# Patient Record
Sex: Male | Born: 1980
Health system: Southern US, Community
[De-identification: ages and names within clinical notes are randomized; demographics above are authoritative.]

## PROBLEM LIST (undated history)

## (undated) DIAGNOSIS — I10 Essential (primary) hypertension: Secondary | ICD-10-CM

## (undated) DIAGNOSIS — K5732 Diverticulitis of large intestine without perforation or abscess without bleeding: Secondary | ICD-10-CM

---

## 2006-07-29 ENCOUNTER — Emergency Department: Payer: Self-pay | Admitting: Emergency Medicine

## 2006-07-31 ENCOUNTER — Emergency Department: Payer: Self-pay | Admitting: Emergency Medicine

## 2019-02-18 ENCOUNTER — Other Ambulatory Visit (HOSPITAL_COMMUNITY): Payer: Self-pay | Admitting: Infectious Diseases

## 2019-02-18 ENCOUNTER — Inpatient Hospital Stay
Admission: AD | Admit: 2019-02-18 | Discharge: 2019-02-20 | DRG: 392 | Disposition: A | Discharge status: Home / Self Care | Payer: 59 | Source: Ambulatory Visit | Attending: Internal Medicine | Admitting: Internal Medicine

## 2019-02-18 ENCOUNTER — Ambulatory Visit
Admission: RE | Admit: 2019-02-18 | Discharge: 2019-02-18 | Disposition: A | Discharge status: Home / Self Care | Payer: 59 | Source: Ambulatory Visit | Attending: Infectious Diseases | Admitting: Infectious Diseases

## 2019-02-18 ENCOUNTER — Other Ambulatory Visit: Payer: Self-pay

## 2019-02-18 ENCOUNTER — Other Ambulatory Visit: Payer: Self-pay | Admitting: Infectious Diseases

## 2019-02-18 DIAGNOSIS — Z79899 Other long term (current) drug therapy: Secondary | ICD-10-CM

## 2019-02-18 DIAGNOSIS — Z79890 Hormone replacement therapy (postmenopausal): Secondary | ICD-10-CM | POA: Diagnosis not present

## 2019-02-18 DIAGNOSIS — K5720 Diverticulitis of large intestine with perforation and abscess without bleeding: Secondary | ICD-10-CM | POA: Diagnosis present

## 2019-02-18 DIAGNOSIS — R1084 Generalized abdominal pain: Secondary | ICD-10-CM

## 2019-02-18 DIAGNOSIS — E039 Hypothyroidism, unspecified: Secondary | ICD-10-CM | POA: Diagnosis present

## 2019-02-18 DIAGNOSIS — D72825 Bandemia: Secondary | ICD-10-CM

## 2019-02-18 DIAGNOSIS — K5792 Diverticulitis of intestine, part unspecified, without perforation or abscess without bleeding: Secondary | ICD-10-CM | POA: Diagnosis present

## 2019-02-18 DIAGNOSIS — F411 Generalized anxiety disorder: Secondary | ICD-10-CM | POA: Diagnosis present

## 2019-02-18 DIAGNOSIS — F329 Major depressive disorder, single episode, unspecified: Secondary | ICD-10-CM | POA: Diagnosis present

## 2019-02-18 DIAGNOSIS — E669 Obesity, unspecified: Secondary | ICD-10-CM | POA: Diagnosis present

## 2019-02-18 DIAGNOSIS — Z6836 Body mass index (BMI) 36.0-36.9, adult: Secondary | ICD-10-CM

## 2019-02-18 DIAGNOSIS — Z20828 Contact with and (suspected) exposure to other viral communicable diseases: Secondary | ICD-10-CM | POA: Diagnosis present

## 2019-02-18 DIAGNOSIS — G4733 Obstructive sleep apnea (adult) (pediatric): Secondary | ICD-10-CM | POA: Diagnosis present

## 2019-02-18 DIAGNOSIS — I10 Essential (primary) hypertension: Secondary | ICD-10-CM | POA: Diagnosis present

## 2019-02-18 DIAGNOSIS — Z87891 Personal history of nicotine dependence: Secondary | ICD-10-CM | POA: Diagnosis not present

## 2019-02-18 HISTORY — DX: Essential (primary) hypertension: I10

## 2019-02-18 LAB — COMPREHENSIVE METABOLIC PANEL
ALT: 22 U/L (ref 0–44)
AST: 22 U/L (ref 15–41)
Albumin: 4.2 g/dL (ref 3.5–5.0)
Alkaline Phosphatase: 80 U/L (ref 38–126)
Anion gap: 14 (ref 5–15)
BUN: 24 mg/dL — ABNORMAL HIGH (ref 6–20)
CO2: 24 mmol/L (ref 22–32)
Calcium: 9.2 mg/dL (ref 8.9–10.3)
Chloride: 96 mmol/L — ABNORMAL LOW (ref 98–111)
Creatinine, Ser: 0.99 mg/dL (ref 0.61–1.24)
GFR calc Af Amer: >60 mL/min (ref >60)
GFR calc non Af Amer: >60 mL/min (ref >60)
Glucose, Bld: 118 mg/dL — ABNORMAL HIGH (ref 70–99)
Potassium: 3.6 mmol/L (ref 3.5–5.1)
Sodium: 134 mmol/L — ABNORMAL LOW (ref 135–145)
Total Bilirubin: 0.7 mg/dL (ref 0.3–1.2)
Total Protein: 8.2 g/dL — ABNORMAL HIGH (ref 6.5–8.1)

## 2019-02-18 LAB — CBC WITH DIFFERENTIAL/PLATELET
Abs Immature Granulocytes: 0.04 10*3/uL (ref 0.00–0.07)
Basophils Absolute: 0 10*3/uL (ref 0.0–0.1)
Basophils Relative: 0 %
Eosinophils Absolute: 0.1 10*3/uL (ref 0.0–0.5)
Eosinophils Relative: 1 %
HCT: 43 % (ref 39.0–52.0)
Hemoglobin: 14.8 g/dL (ref 13.0–17.0)
Immature Granulocytes: 0 %
Lymphocytes Relative: 11 %
Lymphs Abs: 1.3 10*3/uL (ref 0.7–4.0)
MCH: 28.2 pg (ref 26.0–34.0)
MCHC: 34.4 g/dL (ref 30.0–36.0)
MCV: 82.1 fL (ref 80.0–100.0)
Monocytes Absolute: 1.2 10*3/uL — ABNORMAL HIGH (ref 0.1–1.0)
Monocytes Relative: 10 %
Neutro Abs: 8.9 10*3/uL — ABNORMAL HIGH (ref 1.7–7.7)
Neutrophils Relative %: 78 %
Platelets: 319 10*3/uL (ref 150–400)
RBC: 5.24 MIL/uL (ref 4.22–5.81)
RDW: 12.3 % (ref 11.5–15.5)
WBC: 11.5 10*3/uL — ABNORMAL HIGH (ref 4.0–10.5)
nRBC: 0 % (ref 0.0–0.2)

## 2019-02-18 LAB — LIPASE, BLOOD: Lipase: 20 U/L (ref 11–51)

## 2019-02-18 LAB — POCT I-STAT CREATININE: Creatinine, Ser: 1 mg/dL (ref 0.61–1.24)

## 2019-02-18 MED ORDER — BUSPIRONE HCL 15 MG PO TABS
7.5000 mg | ORAL_TABLET | Freq: Every day | ORAL | Status: DC
  Administered 2019-02-19 – 2019-02-20 (×2): 7.5 mg via ORAL
  Filled 2019-02-18 (×3): qty 1

## 2019-02-18 MED ORDER — ACETAMINOPHEN 325 MG PO TABS: 650.0000 mg | ORAL_TABLET | Freq: Four times a day (QID) | ORAL | Status: DC | PRN

## 2019-02-18 MED ORDER — LEVOTHYROXINE SODIUM 50 MCG PO TABS
125.0000 ug | ORAL_TABLET | Freq: Every day | ORAL | Status: DC
  Administered 2019-02-19 – 2019-02-20 (×2): 125 ug via ORAL
  Filled 2019-02-18 (×2): qty 1

## 2019-02-18 MED ORDER — IOHEXOL 300 MG/ML  SOLN
125.0000 mL | Freq: Once | INTRAMUSCULAR | Status: AC | PRN
  Administered 2019-02-18: 12:00:00 125 mL via INTRAVENOUS

## 2019-02-18 MED ORDER — LISINOPRIL 20 MG PO TABS
20.0000 mg | ORAL_TABLET | Freq: Every day | ORAL | Status: DC
  Administered 2019-02-20: 10:00:00 20 mg via ORAL
  Filled 2019-02-18: qty 1

## 2019-02-18 MED ORDER — ONDANSETRON HCL 4 MG/2ML IJ SOLN: 4.0000 mg | Freq: Four times a day (QID) | INTRAMUSCULAR | Status: DC | PRN

## 2019-02-18 MED ORDER — PIPERACILLIN-TAZOBACTAM 3.375 G IVPB
3.3750 g | Freq: Three times a day (TID) | INTRAVENOUS | Status: DC
  Administered 2019-02-18 – 2019-02-20 (×5): 3.375 g via INTRAVENOUS
  Filled 2019-02-18 (×6): qty 50

## 2019-02-18 MED ORDER — ONDANSETRON HCL 4 MG PO TABS: 4.0000 mg | ORAL_TABLET | Freq: Four times a day (QID) | ORAL | Status: DC | PRN

## 2019-02-18 MED ORDER — ENOXAPARIN SODIUM 40 MG/0.4ML ~~LOC~~ SOLN
40.0000 mg | SUBCUTANEOUS | Status: DC
  Administered 2019-02-18: 40 mg via SUBCUTANEOUS
  Filled 2019-02-18: qty 0.4

## 2019-02-18 MED ORDER — HYDROCODONE-ACETAMINOPHEN 5-325 MG PO TABS: 1.0000 | ORAL_TABLET | ORAL | Status: DC | PRN

## 2019-02-18 MED ORDER — SODIUM CHLORIDE 0.9 % IV SOLN
INTRAVENOUS | Status: DC
  Administered 2019-02-18 – 2019-02-20 (×4): via INTRAVENOUS

## 2019-02-18 MED ORDER — MELATONIN 5 MG PO TABS
5.0000 mg | ORAL_TABLET | Freq: Every evening | ORAL | Status: DC | PRN
  Administered 2019-02-18: 5 mg via ORAL
  Filled 2019-02-18 (×3): qty 1

## 2019-02-18 MED ORDER — ACETAMINOPHEN 650 MG RE SUPP: 650.0000 mg | Freq: Four times a day (QID) | RECTAL | Status: DC | PRN

## 2019-02-18 MED ORDER — LISINOPRIL-HYDROCHLOROTHIAZIDE 20-25 MG PO TABS: 1.0000 | ORAL_TABLET | Freq: Every day | ORAL | Status: DC

## 2019-02-18 MED ORDER — ESCITALOPRAM OXALATE 10 MG PO TABS
20.0000 mg | ORAL_TABLET | Freq: Every day | ORAL | Status: DC
  Administered 2019-02-19 – 2019-02-20 (×2): 20 mg via ORAL
  Filled 2019-02-18 (×3): qty 2

## 2019-02-18 MED ORDER — HYDROCHLOROTHIAZIDE 25 MG PO TABS
25.0000 mg | ORAL_TABLET | Freq: Every day | ORAL | Status: DC
  Administered 2019-02-20: 25 mg via ORAL
  Filled 2019-02-18: qty 1

## 2019-02-18 MED ORDER — POLYETHYLENE GLYCOL 3350 17 G PO PACK: 17.0000 g | PACK | Freq: Every day | ORAL | Status: DC | PRN

## 2019-02-18 NOTE — H&P (Signed)
[facility] [place] NAME: Jordan Church    MR#:  [id number]  DATE OF BIRTH:  31-Jan-1981  DATE OF ADMISSION:  02/18/2019  PRIMARY CARE PHYSICIAN: Leonel Ramsay, MD   REQUESTING/REFERRING PHYSICIAN: Adrian Prows, MD  CHIEF COMPLAINT:  No chief complaint on file.  Abdominal Pain  HISTORY OF PRESENT ILLNESS:   38 year old male with past medical history of OSA on CPAP, obesity, bronchitis, generalized anxiety disorder, hypertension, depression and hypothyroidism presenting as a direct admit from PCPs office with complaints of abdominal pain.  Patient reports onset of symptoms since last Thursday with associated constipation and nausea without vomiting.  He saw his PCP and was started on Augmentin with patient reporting improvement.  Patient states he was able to have a bowel movement since starting Augmentin however he still having abdominal pain with new onset of pain and chills.  He called his PCP who reported CT of the abdomen and pelvis and lab work.  Initial labs at PCPs office showed elevated white count 20.5.  CT abdomen revealed diverticulitis with possible abscess.  Given this finding patient was admitted to hospitalist service for further management.  On arrival to the unit, he was afebrile with blood pressure 108/66 mm Hg and pulse rate 85 beats/min. There were no focal neurological deficits; he was alert and oriented x4.  Initial labs revealed sodium 134, glucose 92, BUN 24 otherwise unremarkable CMP, WBC 11.5, lipase 20.    PAST MEDICAL HISTORY:  History reviewed. No pertinent past medical history.  PAST SURGICAL HISTORY:  History reviewed. No pertinent surgical history.  SOCIAL HISTORY:   Social History   Tobacco Use  . Smoking status: Former Research scientist (life sciences)  . Smokeless tobacco: Never Used  Substance Use Topics  . Alcohol use: Not on file    FAMILY HISTORY:  History reviewed. No pertinent family history.  DRUG  ALLERGIES:  Not on File  REVIEW OF SYSTEMS:   Review of Systems  Constitutional: Positive for chills, diaphoresis and fever. Negative for malaise/fatigue and weight loss.  HENT: Negative for congestion, hearing loss and sore throat.   Eyes: Negative for blurred vision and double vision.  Respiratory: Negative for cough, shortness of breath and wheezing.   Cardiovascular: Negative for chest pain, palpitations, orthopnea and leg swelling.  Gastrointestinal: Positive for abdominal pain, constipation and nausea. Negative for diarrhea and vomiting.  Genitourinary: Negative for dysuria and urgency.  Musculoskeletal: Negative for myalgias.  Skin: Negative for rash.  Neurological: Negative for dizziness, sensory change, speech change, focal weakness and headaches.  Psychiatric/Behavioral: Negative for depression.   MEDICATIONS AT HOME:   Prior to Admission medications   Medication Sig Start Date End Date Taking? Authorizing Provider  busPIRone (BUSPAR) 7.5 MG tablet Take 7.5 mg by mouth daily.   Yes [provider]  escitalopram (LEXAPRO) 20 MG tablet Take 20 mg by mouth daily.   Yes [provider]  levothyroxine (SYNTHROID) 125 MCG tablet Take 125 mcg by mouth daily before breakfast.   Yes [provider]  lisinopril-hydrochlorothiazide (ZESTORETIC) 20-25 MG tablet Take 1 tablet by mouth daily.   Yes [provider]  Melatonin 5 MG TABS Take 5 mg by mouth at bedtime as needed.   Yes [provider]      VITAL SIGNS:  Blood pressure 117/61, pulse 76, temperature 99.5 F (37.5 C), temperature source Oral, resp. rate 20, height 5\' 11"  (1.803 m), weight 118.8 kg, SpO2 97 %.  PHYSICAL EXAMINATION:  Physical Exam  GENERAL:  38 y.o.-year-old patient lying in the bed with no acute distress.  EYES: Pupils equal, round, reactive to light and accommodation. No scleral icterus. Extraocular muscles intact.  HEENT: Head atraumatic, normocephalic.  Oropharynx and nasopharynx clear.  NECK:  Supple, no jugular venous distention. No thyroid enlargement, no tenderness.  LUNGS: Normal breath sounds bilaterally, no wheezing, rales,rhonchi or crepitation. No use of accessory muscles of respiration.  CARDIOVASCULAR: S1, S2 normal. No murmurs, rubs, or gallops.  ABDOMEN: Soft, nontender, nondistended. Bowel sounds present. No organomegaly or mass.  EXTREMITIES: No pedal edema, cyanosis, or clubbing. No rash or lesions. + pedal pulses MUSCULOSKELETAL: Normal bulk, and power was 5+ grip and elbow, knee, and ankle flexion and extension bilaterally.  NEUROLOGIC:Alert and oriented x 3. CN 2-12 intact. Sensation to light touch and cold stimuli intact bilaterally. Gait not tested due to safety concern. PSYCHIATRIC: The patient is alert and oriented x 3.  SKIN: No obvious rash, lesion, or ulcer.   DATA REVIEWED:  LABORATORY PANEL:   CBC Recent Labs  Lab 02/18/19 1554  WBC 11.5*  HGB 14.8  HCT 43.0  PLT 319   ------------------------------------------------------------------------------------------------------------------  Chemistries  Recent Labs  Lab 02/18/19 1554  NA 134*  K 3.6  CL 96*  CO2 24  GLUCOSE 118*  BUN 24*  CREATININE 0.99  CALCIUM 9.2  AST 22  ALT 22  ALKPHOS 80  BILITOT 0.7   ------------------------------------------------------------------------------------------------------------------  Cardiac Enzymes No results for input(s): TROPONINI in the last 168 hours. ------------------------------------------------------------------------------------------------------------------  RADIOLOGY:  No results found.  EKG:  EKG: there are no previous tracings available   IMPRESSION AND PLAN:   38 y.o. male  with past medical history of OSA on CPAP, obesity, bronchitis, generalized anxiety disorder, hypertension, depression and hypothyroidism presenting as a direct admit from PCPs office with complaints of abdominal  pain.  1. Acute diverticulitis with abscess - patient with acute abdominal pain, fevers and chills - Admit to medsurg unit - CT abdomen pelvis with findings of acute diverticulitis and possible abscess - Check CBC, CMP, and lipase - Blood cultures - IV fluids hydration - PRN IV and p.o. pain management - Start empiric antibiotics with Zosyn - Surgery consult for evaluation of abscess, may also need GI  2. HTN  + Goal BP <130/80 -Continue lisinopril  3. Hypothyroidism -continue Synthroid  4. Anxiety and depression -continue Lexapro and BuSpar  5. OSA -on CPAP  6. DVT prophylaxis - Enoxaparin SubQ    All the records are reviewed and case discussed with ED provider. Management plans discussed with the patient, family and they are in agreement.  CODE STATUS: FULL  TOTAL TIME TAKING CARE OF THIS PATIENT: 50 minutes.    on 02/18/2019 at 10:02 PM  Webb SilversmithElizabeth Ouma, DNP, FNP-BC [facility] Nurse Practitioner Between 7am to 6pm - Pager [phone]- [phone]  After 6pm go to [email or web address] - Social research officer, governmentpassword [email or web address]  [facility]  Office  [phone]  CC: Primary care physician; Mick SellFitzgerald, David P, MD

## 2019-02-18 NOTE — Consult Note (Signed)
Pharmacy Antibiotic Note  Jordan Church is a 38 y.o. male admitted on 02/18/2019 with intra-abdominal infection.  Pharmacy has been consulted for Zosyn dosing.  Plan: Zosyn 3.375g IV q8h (4 hour infusion).  Height: 5\' 11"  (180.3 cm) Weight: 261 lb 14.5 oz (118.8 kg) IBW/kg (Calculated) : 75.3  Temp (24hrs), Avg:98.6 F (37 C), Min:98.6 F (37 C), Max:98.6 F (37 C)  Recent Labs  Lab 02/18/19 1554  WBC 11.5*  CREATININE 0.99    Estimated Creatinine Clearance: 132.7 mL/min (by C-G formula based on SCr of 0.99 mg/dL).    Not on File  Antimicrobials this admission: Zosyn 8/24 >>    Microbiology results: None ordered currently  Thank you for allowing pharmacy to be a part of this patient's care.  Glennie Rodda A Josy Peaden 02/18/2019 5:15 PM

## 2019-02-19 LAB — SARS CORONAVIRUS 2 (TAT 6-24 HRS): SARS Coronavirus 2: NEGATIVE

## 2019-02-19 LAB — HIV ANTIBODY (ROUTINE TESTING W REFLEX): HIV Screen 4th Generation wRfx: NONREACTIVE

## 2019-02-19 NOTE — Progress Notes (Signed)
[facility] [place] [facility]                                                                                                                                                                                  Patient Demographics   Jordan Church, is a 38 y.o. male, DOB - 1980/08/22, UJW:[id number]  Admit date - 02/18/2019   Admitting Physician Jimmye NormanElizabeth Achieng Ouma, NP  Outpatient Primary MD for the patient is Mick SellFitzgerald, David P, MD   LOS - 1  Subjective:     Review of Systems:   CONSTITUTIONAL: No documented fever. No fatigue, weakness. No weight gain, no weight loss.  EYES: No blurry or double vision.  ENT: No tinnitus. No postnasal drip. No redness of the oropharynx.  RESPIRATORY: No cough, no wheeze, no hemoptysis. No dyspnea.  CARDIOVASCULAR: No chest pain. No orthopnea. No palpitations. No syncope.  GASTROINTESTINAL: No nausea, no vomiting or diarrhea. No abdominal pain. No melena or hematochezia.  GENITOURINARY: No dysuria or hematuria.  ENDOCRINE: No polyuria or nocturia. No heat or cold intolerance.  HEMATOLOGY: No anemia. No bruising. No bleeding.  INTEGUMENTARY: No rashes. No lesions.  MUSCULOSKELETAL: No arthritis. No swelling. No gout.  NEUROLOGIC: No numbness, tingling, or ataxia. No seizure-type activity.  PSYCHIATRIC: No anxiety. No insomnia. No ADD.    Vitals:   Vitals:   02/18/19 2016 02/18/19 2327 02/19/19 0507 02/19/19 1153  BP: 117/61  106/67 113/71  Pulse: 76 68 63 62  Resp: 20 18 18 18   Temp: 99.5 F (37.5 C)  97.9 F (36.6 C) 98.5 F (36.9 C)  TempSrc: Oral  Oral Oral  SpO2: 97% 98% 99% 97%  Weight:      Height:        Wt Readings from Last 3 Encounters:  02/18/19 118.8 kg     Intake/Output Summary (Last 24 hours) at 02/19/2019 1349 Last data filed at 02/19/2019 1152 Gross per 24 hour  Intake 2482.4 ml  Output -  Net 2482.4 ml    Physical Exam:   GENERAL: Pleasant-appearing in no apparent distress.  HEAD,  EYES, EARS, NOSE AND THROAT: Atraumatic, normocephalic. Extraocular muscles are intact. Pupils equal and reactive to light. Sclerae anicteric. No conjunctival injection. No oro-pharyngeal erythema.  NECK: Supple. There is no jugular venous distention. No bruits, no lymphadenopathy, no thyromegaly.  HEART: Regular rate and rhythm,. No murmurs, no rubs, no clicks.  LUNGS: Clear to auscultation bilaterally. No rales or rhonchi. No wheezes.  ABDOMEN: Soft, flat, nontender, nondistended. Has good bowel sounds. No hepatosplenomegaly appreciated.  EXTREMITIES: No evidence of any cyanosis, clubbing, or peripheral edema.  +2 pedal and radial pulses bilaterally.  NEUROLOGIC: The  patient is alert, awake, and oriented x3 with no focal motor or sensory deficits appreciated bilaterally.  SKIN: Moist and warm with no rashes appreciated.  Psych: Not anxious, depressed LN: No inguinal LN enlargement    Antibiotics   Anti-infectives (From admission, onward)   Start     Dose/Rate Route Frequency Ordered Stop   02/18/19 1730  piperacillin-tazobactam (ZOSYN) IVPB 3.375 g     3.375 g 12.5 mL/hr over 240 Minutes Intravenous Every 8 hours 02/18/19 1709        Medications   Scheduled Meds: . busPIRone  7.5 mg Oral Daily  . enoxaparin (LOVENOX) injection  40 mg Subcutaneous Q24H  . escitalopram  20 mg Oral Daily  . lisinopril  20 mg Oral Daily   And  . hydrochlorothiazide  25 mg Oral Daily  . levothyroxine  125 mcg Oral Q0600   Continuous Infusions: . sodium chloride 100 mL/hr at 02/19/19 1152  . piperacillin-tazobactam (ZOSYN)  IV Stopped (02/19/19 0915)   PRN Meds:.acetaminophen **OR** acetaminophen, HYDROcodone-acetaminophen, Melatonin, ondansetron **OR** ondansetron (ZOFRAN) IV, polyethylene glycol   Data Review:   Micro Results Recent Results (from the past 240 hour(s))  SARS CORONAVIRUS 2 (TAT 6-12 HRS) Nasal Swab Aptima Multi Swab     Status: None   Collection Time: 02/18/19  3:58 PM    Specimen: Aptima Multi Swab; Nasal Swab  Result Value Ref Range Status   SARS Coronavirus 2 NEGATIVE NEGATIVE Final    Comment: (NOTE) SARS-CoV-2 target nucleic acids are NOT DETECTED. The SARS-CoV-2 RNA is generally detectable in upper and lower respiratory specimens during the acute phase of infection. Negative results do not preclude SARS-CoV-2 infection, do not rule out co-infections with other pathogens, and should not be used as the sole basis for treatment or other patient management decisions. Negative results must be combined with clinical observations, patient history, and epidemiological information. The expected result is Negative. Fact Sheet for Patients: SugarRoll.be Fact Sheet for Healthcare Providers: https://www.woods-mathews.com/ This test is not yet approved or cleared by the [place] FDA and  has been authorized for detection and/or diagnosis of SARS-CoV-2 by FDA under an Emergency Use Authorization (EUA). This EUA will remain  in effect (meaning this test can be used) for the duration of the COVID-19 declaration under Section 56 4(b)(1) of the Act, 21 U.S.C. section 360bbb-3(b)(1), unless the authorization is terminated or revoked sooner. Performed at [facility], [place] [address], [place], [place] [address]   CULTURE, BLOOD (ROUTINE X 2) w Reflex to ID Panel     Status: None (Preliminary result)   Collection Time: 02/18/19  7:22 PM   Specimen: BLOOD  Result Value Ref Range Status   Specimen Description BLOOD LFOA  Final   Special Requests   Final    BOTTLES DRAWN AEROBIC AND ANAEROBIC Blood Culture adequate volume   Culture   Final    NO GROWTH < 12 HOURS Performed at [facility], [address], [place], [place] [address]    Report Status PENDING  Incomplete  CULTURE, BLOOD (ROUTINE X 2) w Reflex to ID Panel     Status: None (Preliminary result)   Collection Time: 02/18/19  7:29 PM    Specimen: BLOOD  Result Value Ref Range Status   Specimen Description BLOOD LAC  Final   Special Requests   Final    BOTTLES DRAWN AEROBIC AND ANAEROBIC Blood Culture adequate volume   Culture   Final    NO GROWTH < 12 HOURS  Performed at [facility], [address] [place], [place] [address]    Report Status PENDING  Incomplete    Radiology Reports Ct Abdomen Pelvis W Contrast  Result Date: 02/18/2019 CLINICAL DATA:  Lower abdominal pain with chills for 4 days. EXAM: CT ABDOMEN AND PELVIS WITH CONTRAST TECHNIQUE: Multidetector CT imaging of the abdomen and pelvis was performed using the standard protocol following bolus administration of intravenous contrast. CONTRAST:  125mL OMNIPAQUE IOHEXOL 300 MG/ML  SOLN COMPARISON:  None. FINDINGS: Lower chest: Small type 1 hiatal hernia. Hepatobiliary: Unremarkable Pancreas: Unremarkable Spleen: Unremarkable Adrenals/Urinary Tract: Unremarkable Stomach/Bowel: The appendix measures 6-7 mm in diameter, upper normal. There is abnormal inflammatory stranding in the sigmoid colon mesentery somewhat eccentric to the left the most closely associated with the sigmoid colon which demonstrates diverticulosis. This may be a manifestation of recent diverticulitis. There is a small fluid density collection measuring 1.9 by 1.4 by 2.4 cm within the sigmoid colon mesentery on image 76/2 which may represent a small abscess although there is no internal gas. Also anterior to the upper rectum there is a 2.3 by 3.5 by 2.6 cm collection of fluid which may be free-flowing but incipient abscess formation is difficult to exclude given the slight enhancement along the margins. There is generalized wall thickening in the sigmoid colon and upper rectum although some of this may be secondary to diverticulosis and some secondary to nondistention. Small amount of stranding is noted along the lower omentum. Vascular/Lymphatic: Borderline prominent peripancreatic lymph nodes  including a 1.0 cm in short axis node on image 27/2 and another on image 32/2. Reproductive: Unremarkable Other: No supplemental non-categorized findings. Musculoskeletal: Unremarkable IMPRESSION: 1. Abnormal inflammatory stranding in the sigmoid mesentery with wall thickening in the upper rectum and sigmoid colon along with sigmoid colon diverticulosis. Along there is a small the fluid collection eccentric to the left sigmoid mesentery and another collection of fluid anterior to the upper rectum, the former is suspicious for a small abscess in the latter may be an incipient abscess given the mild enhancement along its margins. Suspect that this represents findings related to acute diverticulitis. 2. Borderline prominent peripancreatic lymph nodes, likely reactive. 3. Small type 1 hiatal hernia. Electronically Signed   By: Gaylyn RongWalter  Liebkemann M.D.   On: 02/18/2019 12:41     CBC Recent Labs  Lab 02/18/19 1554  WBC 11.5*  HGB 14.8  HCT 43.0  PLT 319  MCV 82.1  MCH 28.2  MCHC 34.4  RDW 12.3  LYMPHSABS 1.3  MONOABS 1.2*  EOSABS 0.1  BASOSABS 0.0    Chemistries  Recent Labs  Lab 02/18/19 1211 02/18/19 1554  NA  --  134*  K  --  3.6  CL  --  96*  CO2  --  24  GLUCOSE  --  118*  BUN  --  24*  CREATININE 1.00 0.99  CALCIUM  --  9.2  AST  --  22  ALT  --  22  ALKPHOS  --  80  BILITOT  --  0.7   ------------------------------------------------------------------------------------------------------------------ estimated creatinine clearance is 132.7 mL/min (by C-G formula based on SCr of 0.99 mg/dL). ------------------------------------------------------------------------------------------------------------------ No results for input(s): HGBA1C in the last 72 hours. ------------------------------------------------------------------------------------------------------------------ No results for input(s): CHOL, HDL, LDLCALC, TRIG, CHOLHDL, LDLDIRECT in the last 72  hours. ------------------------------------------------------------------------------------------------------------------ No results for input(s): TSH, T4TOTAL, T3FREE, THYROIDAB in the last 72 hours.  Invalid input(s): FREET3 ------------------------------------------------------------------------------------------------------------------ No results for input(s): VITAMINB12, FOLATE, FERRITIN, TIBC, IRON, RETICCTPCT in the  last 72 hours.  Coagulation profile No results for input(s): INR, PROTIME in the last 168 hours.  No results for input(s): DDIMER in the last 72 hours.  Cardiac Enzymes No results for input(s): CKMB, TROPONINI, MYOGLOBIN in the last 168 hours.  Invalid input(s): CK ------------------------------------------------------------------------------------------------------------------ Invalid input(s): POCBNP    Assessment & Plan   38 y.o. male  with past medical history of OSA on CPAP, obesity, bronchitis, generalized anxiety disorder, hypertension, depression and hypothyroidism presenting as a direct admit from PCPs office with complaints of abdominal pain.  1. Acute diverticulitis with abscess - patient with acute abdominal pain, fevers and chills Continue IV Zosyn Abscesses noted in the abdomen  2. HTN  + Goal BP <130/80 -Hold lisinopril  3. Hypothyroidism -continue Synthroid  4. Anxiety and depression -continue Lexapro and BuSpar  5. OSA -on CPAP  6. DVT prophylaxis - Enoxaparin SubQ      Code Status Orders  (From admission, onward)         Start     Ordered   02/18/19 1544  Full code  Continuous     02/18/19 1546        Code Status History    This patient has a current code status but no historical code status.   Advance Care Planning Activity           Consults  surgery  DVT Prophylaxis  Lovenox    Lab Results  Component Value Date   PLT 319 02/18/2019     Time Spent in minutes  35min Greater than 50% of time spent  in care coordination and counseling patient regarding the condition and plan of care.   Auburn BilberryShreyang Jamerson Vonbargen M.D on 02/19/2019 at 1:49 PM  Between 7am to 6pm - Pager - [phone]  After 6pm go to [email or web address] - Social research officer, governmentpassword EPAS [email or web address]  [facility]   Office  [phone]

## 2019-02-19 NOTE — Progress Notes (Signed)
Subjective:   CC: diverticulitis  HPI:  Jordan Church is a 38 y.o. male who was consulted by Allena KatzPatel for issue above.  Symptoms were first noted 5 days ago. Pain is sudden onset, confined to the suparpubic area, without radiation.  Associated with diarrhea, exacerbated by nothing specific.    Started on abx with primary, but no improvement.  CT showed possible abscess so admitted for further obs, IV abx.     Past Medical History:  has a past medical history of Hypertension.  Past Surgical History: none reported  Family History: aunt and uncle with Crohn's  Social History:  reports that he has quit smoking. He has never used smokeless tobacco. No history on file for alcohol and drug.  Current Medications:  Medications Prior to Admission  Medication Sig Dispense Refill  . busPIRone (BUSPAR) 7.5 MG tablet Take 7.5 mg by mouth daily.    Marland Kitchen. escitalopram (LEXAPRO) 20 MG tablet Take 20 mg by mouth daily.    Marland Kitchen. levothyroxine (SYNTHROID) 125 MCG tablet Take 125 mcg by mouth daily before breakfast.    . lisinopril-hydrochlorothiazide (ZESTORETIC) 20-25 MG tablet Take 1 tablet by mouth daily.    . Melatonin 5 MG TABS Take 5 mg by mouth at bedtime as needed.      Allergies:  Allergies as of 02/18/2019  . (Not on File)    ROS:  General: Denies weight loss, weight gain, fatigue, fevers, chills, and night sweats. Eyes: Denies blurry vision, double vision, eye pain, itchy eyes, and tearing. Ears: Denies hearing loss, earache, and ringing in ears. Nose: Denies sinus pain, congestion, infections, runny nose, and nosebleeds. Mouth/throat: Denies hoarseness, sore throat, bleeding gums, and difficulty swallowing. Heart: Denies chest pain, palpitations, racing heart, irregular heartbeat, leg pain or swelling, and decreased activity tolerance. Respiratory: Denies breathing difficulty, shortness of breath, wheezing, cough, and sputum. GI: Denies change in appetite, heartburn, nausea, vomiting,  constipation, and blood in stool. GU: Denies difficulty urinating, pain with urinating, urgency, frequency, blood in urine. Musculoskeletal: Denies joint stiffness, pain, swelling, muscle weakness. Skin: Denies rash, itching, mass, tumors, sores, and boils Neurologic: Denies headache, fainting, dizziness, seizures, numbness, and tingling. Psychiatric: Denies depression, anxiety, difficulty sleeping, and memory loss. Endocrine: Denies heat or cold intolerance, and increased thirst or urination. Blood/lymph: Denies easy bruising, easy bruising, and swollen glands     Objective:     BP 113/71 (BP Location: Left Arm)   Pulse 62   Temp 98.5 F (36.9 C) (Oral)   Resp 18   Ht 5\' 11"  (1.803 m)   Wt 118.8 kg   SpO2 97%   BMI 36.53 kg/m   Constitutional :  alert, cooperative, appears stated age and no distress  Lymphatics/Throat:  no asymmetry, masses, or scars  Respiratory:  clear to auscultation bilaterally  Cardiovascular:  regular rate and rhythm  Gastrointestinal: soft, focal tenderness in suprapubic region, no guarding.   Musculoskeletal: Steady movements  Skin: Cool and moist   Psychiatric: Normal affect, non-agitated, not confused       LABS:  CMP Latest Ref Rng & Units 02/18/2019 02/18/2019  Glucose 70 - 99 mg/dL 409(W118(H) -  BUN 6 - 20 mg/dL 11(B24(H) -  Creatinine 1.470.61 - 1.24 mg/dL 8.290.99 5.621.00  Sodium 130135 - 145 mmol/L 134(L) -  Potassium 3.5 - 5.1 mmol/L 3.6 -  Chloride 98 - 111 mmol/L 96(L) -  CO2 22 - 32 mmol/L 24 -  Calcium 8.9 - 10.3 mg/dL 9.2 -  Total Protein 6.5 -  8.1 g/dL 8.2(H) -  Total Bilirubin 0.3 - 1.2 mg/dL 0.7 -  Alkaline Phos 38 - 126 U/L 80 -  AST 15 - 41 U/L 22 -  ALT 0 - 44 U/L 22 -   CBC Latest Ref Rng & Units 02/18/2019  WBC 4.0 - 10.5 K/uL 11.5(H)  Hemoglobin 13.0 - 17.0 g/dL 14.8  Hematocrit 39.0 - 52.0 % 43.0  Platelets 150 - 400 K/uL 319    RADS: CLINICAL DATA:  Lower abdominal pain with chills for 4 days.  EXAM: CT ABDOMEN AND PELVIS WITH  CONTRAST  TECHNIQUE: Multidetector CT imaging of the abdomen and pelvis was performed using the standard protocol following bolus administration of intravenous contrast.  CONTRAST:  126mL OMNIPAQUE IOHEXOL 300 MG/ML  SOLN  COMPARISON:  None.  FINDINGS: Lower chest: Small type 1 hiatal hernia.  Hepatobiliary: Unremarkable  Pancreas: Unremarkable  Spleen: Unremarkable  Adrenals/Urinary Tract: Unremarkable  Stomach/Bowel: The appendix measures 6-7 mm in diameter, upper normal.  There is abnormal inflammatory stranding in the sigmoid colon mesentery somewhat eccentric to the left the most closely associated with the sigmoid colon which demonstrates diverticulosis. This may be a manifestation of recent diverticulitis. There is a small fluid density collection measuring 1.9 by 1.4 by 2.4 cm within the sigmoid colon mesentery on image 76/2 which may represent a small abscess although there is no internal gas. Also anterior to the upper rectum there is a 2.3 by 3.5 by 2.6 cm collection of fluid which may be free-flowing but incipient abscess formation is difficult to exclude given the slight enhancement along the margins.  There is generalized wall thickening in the sigmoid colon and upper rectum although some of this may be secondary to diverticulosis and some secondary to nondistention. Small amount of stranding is noted along the lower omentum.  Vascular/Lymphatic: Borderline prominent peripancreatic lymph nodes including a 1.0 cm in short axis node on image 27/2 and another on image 32/2.  Reproductive: Unremarkable  Other: No supplemental non-categorized findings.  Musculoskeletal: Unremarkable  IMPRESSION: 1. Abnormal inflammatory stranding in the sigmoid mesentery with wall thickening in the upper rectum and sigmoid colon along with sigmoid colon diverticulosis. Along there is a small the fluid collection eccentric to the left sigmoid mesentery  and another collection of fluid anterior to the upper rectum, the former is suspicious for a small abscess in the latter may be an incipient abscess given the mild enhancement along its margins. Suspect that this represents findings related to acute diverticulitis. 2. Borderline prominent peripancreatic lymph nodes, likely reactive. 3. Small type 1 hiatal hernia.   Electronically Signed   By: Van Clines M.D.   On: 02/18/2019 12:41  Assessment:   Diverticulitis Hypertension Hypothyroidism OSA Anxiety and depression  Plan:    Fluid collections noted on CT scan likely too small to be amenable for IR guided drainage.  Patient overall states he is feeling okay, no evidence of diffuse peritonitis, and white count is only slightly elevated.  Therefore we will continue IV antibiotics and clear liquid diet for today and continue to monitor for now.  No additional risk factors for any underlying pathology such as colon cancer, we did discuss the need for follow-up colonoscopy as an outpatient once this acute episode resolves.  All questions and concerns addressed this time.  OSA-CPAP noted in room recommended continued usage.  Hypertension, hypothyroidism, anxiety and depression- recommend continue home meds per primary.

## 2019-02-20 LAB — CBC
HCT: 38.5 % — ABNORMAL LOW (ref 39.0–52.0)
Hemoglobin: 13 g/dL (ref 13.0–17.0)
MCH: 28.4 pg (ref 26.0–34.0)
MCHC: 33.8 g/dL (ref 30.0–36.0)
MCV: 84.1 fL (ref 80.0–100.0)
Platelets: 269 10*3/uL (ref 150–400)
RBC: 4.58 MIL/uL (ref 4.22–5.81)
RDW: 12.1 % (ref 11.5–15.5)
WBC: 10.2 10*3/uL (ref 4.0–10.5)
nRBC: 0 % (ref 0.0–0.2)

## 2019-02-20 LAB — BASIC METABOLIC PANEL
Anion gap: 9 (ref 5–15)
BUN: 13 mg/dL (ref 6–20)
CO2: 27 mmol/L (ref 22–32)
Calcium: 8.8 mg/dL — ABNORMAL LOW (ref 8.9–10.3)
Chloride: 103 mmol/L (ref 98–111)
Creatinine, Ser: 0.92 mg/dL (ref 0.61–1.24)
GFR calc Af Amer: >60 mL/min (ref >60)
GFR calc non Af Amer: >60 mL/min (ref >60)
Glucose, Bld: 101 mg/dL — ABNORMAL HIGH (ref 70–99)
Potassium: 3.8 mmol/L (ref 3.5–5.1)
Sodium: 139 mmol/L (ref 135–145)

## 2019-02-20 MED ORDER — CIPROFLOXACIN HCL 500 MG PO TABS: 500.0000 mg | ORAL_TABLET | Freq: Two times a day (BID) | ORAL | 0 refills | Status: AC

## 2019-02-20 MED ORDER — METRONIDAZOLE 500 MG PO TABS: 500.0000 mg | ORAL_TABLET | Freq: Three times a day (TID) | ORAL | 0 refills | Status: AC

## 2019-02-20 NOTE — Progress Notes (Addendum)
Subjective:  CC: Jordan Church is a 38 y.o. male  Hospital stay day 2,   acute diverticulitis  HPI: No acute issues overnight.  Pain resolved, tolerating regular diet  ROS:  General: Denies weight loss, weight gain, fatigue, fevers, chills, and night sweats. Heart: Denies chest pain, palpitations, racing heart, irregular heartbeat, leg pain or swelling, and decreased activity tolerance. Respiratory: Denies breathing difficulty, shortness of breath, wheezing, cough, and sputum. GI: Denies change in appetite, heartburn, nausea, vomiting, constipation, diarrhea, and blood in stool. GU: Denies difficulty urinating, pain with urinating, urgency, frequency, blood in urine.   Objective:   Temp:  [97.7 F (36.5 C)-98.9 F (37.2 C)] 97.7 F (36.5 C) (08/26 0443) Pulse Rate:  [54-64] 54 (08/26 0443) Resp:  [18-20] 20 (08/26 0443) BP: (113-121)/(66-75) 121/75 (08/26 0443) SpO2:  [95 %-98 %] 95 % (08/26 0443)     Height: 5\' 11"  (180.3 cm) Weight: 118.8 kg BMI (Calculated): 36.54   Intake/Output this shift:   Intake/Output Summary (Last 24 hours) at 02/20/2019 1042 Last data filed at 02/20/2019 4268 Gross per 24 hour  Intake 2964.94 ml  Output -  Net 2964.94 ml    Constitutional :  alert, cooperative, appears stated age and no distress  Respiratory:  clear to auscultation bilaterally  Cardiovascular:  regular rate and rhythm  Gastrointestinal: soft, non-tender; bowel sounds normal; no masses,  no organomegaly.   Skin: Cool and moist.   Psychiatric: Normal affect, non-agitated, not confused       LABS:  CMP Latest Ref Rng & Units 02/20/2019 02/18/2019 02/18/2019  Glucose 70 - 99 mg/dL 101(H) 118(H) -  BUN 6 - 20 mg/dL 13 24(H) -  Creatinine 0.61 - 1.24 mg/dL 0.92 0.99 1.00  Sodium 135 - 145 mmol/L 139 134(L) -  Potassium 3.5 - 5.1 mmol/L 3.8 3.6 -  Chloride 98 - 111 mmol/L 103 96(L) -  CO2 22 - 32 mmol/L 27 24 -  Calcium 8.9 - 10.3 mg/dL 8.8(L) 9.2 -  Total Protein 6.5 - 8.1  g/dL - 8.2(H) -  Total Bilirubin 0.3 - 1.2 mg/dL - 0.7 -  Alkaline Phos 38 - 126 U/L - 80 -  AST 15 - 41 U/L - 22 -  ALT 0 - 44 U/L - 22 -   CBC Latest Ref Rng & Units 02/20/2019 02/18/2019  WBC 4.0 - 10.5 K/uL 10.2 11.5(H)  Hemoglobin 13.0 - 17.0 g/dL 13.0 14.8  Hematocrit 39.0 - 52.0 % 38.5(L) 43.0  Platelets 150 - 400 K/uL 269 319    RADS: n/a Assessment:   Acute diverticulitis.  Resolved. Wbc normal, tolerating diet, BM x1, no pain.  Will finish abx course as outpt.  F/u 8wks or so for possible colonoscopy. Pt verbalized understanding.  Primary, RN and pt all agreement with plan.

## 2019-02-20 NOTE — Discharge Summary (Signed)
[facility] - [place] at [facility], 38 y.o., DOB 1981-06-11, MRN [id number]. Admission date: 02/18/2019 Discharge Date 02/20/2019 Primary MD Leonel Ramsay, MD Admitting Physician Lang Snow, NP  Admission Diagnosis  Diverticulitis with abscess  Discharge Diagnosis   Active Problems:   Acute diverticulitis with small abscess Essential hypertension Hypothyroidism Anxiety and depression Obstructive sleep     Hospital Course 38 y.o.malewith past medical history of OSA on CPAP, obesity, bronchitis, generalized anxiety disorder, hypertension, depression and hypothyroidism presenting as a direct admit from PCPs office with complaints of abdominal pain.  Patient was evaluated and noted to have a CT scan consistent with acute diverticulitis associated with small abscesses formed.  Patient was started on IV antibiotics.  And was seen by surgery.  Patient symptoms are now resolved and he is doing well.  Patient will be switched to Cipro and Flagyl since he was on Augmentin as outpatient.  Surgery recommended follow-up in 8 weeks for possible colonoscopy.            Consults  general surgery  Significant Tests:  See full reports for all details     Ct Abdomen Pelvis W Contrast  Result Date: 02/18/2019 CLINICAL DATA:  Lower abdominal pain with chills for 4 days. EXAM: CT ABDOMEN AND PELVIS WITH CONTRAST TECHNIQUE: Multidetector CT imaging of the abdomen and pelvis was performed using the standard protocol following bolus administration of intravenous contrast. CONTRAST:  160mL OMNIPAQUE IOHEXOL 300 MG/ML  SOLN COMPARISON:  None. FINDINGS: Lower chest: Small type 1 hiatal hernia. Hepatobiliary: Unremarkable Pancreas: Unremarkable Spleen: Unremarkable Adrenals/Urinary Tract: Unremarkable Stomach/Bowel: The appendix measures 6-7 mm in diameter, upper normal. There is abnormal inflammatory stranding in the sigmoid colon mesentery somewhat  eccentric to the left the most closely associated with the sigmoid colon which demonstrates diverticulosis. This may be a manifestation of recent diverticulitis. There is a small fluid density collection measuring 1.9 by 1.4 by 2.4 cm within the sigmoid colon mesentery on image 76/2 which may represent a small abscess although there is no internal gas. Also anterior to the upper rectum there is a 2.3 by 3.5 by 2.6 cm collection of fluid which may be free-flowing but incipient abscess formation is difficult to exclude given the slight enhancement along the margins. There is generalized wall thickening in the sigmoid colon and upper rectum although some of this may be secondary to diverticulosis and some secondary to nondistention. Small amount of stranding is noted along the lower omentum. Vascular/Lymphatic: Borderline prominent peripancreatic lymph nodes including a 1.0 cm in short axis node on image 27/2 and another on image 32/2. Reproductive: Unremarkable Other: No supplemental non-categorized findings. Musculoskeletal: Unremarkable IMPRESSION: 1. Abnormal inflammatory stranding in the sigmoid mesentery with wall thickening in the upper rectum and sigmoid colon along with sigmoid colon diverticulosis. Along there is a small the fluid collection eccentric to the left sigmoid mesentery and another collection of fluid anterior to the upper rectum, the former is suspicious for a small abscess in the latter may be an incipient abscess given the mild enhancement along its margins. Suspect that this represents findings related to acute diverticulitis. 2. Borderline prominent peripancreatic lymph nodes, likely reactive. 3. Small type 1 hiatal hernia. Electronically Signed   By: Van Clines M.D.   On: 02/18/2019 12:41       Today   Subjective:   Jordan Church patient doing well denies any abdominal pain Objective:   Blood pressure 132/69, pulse 66, temperature 98.4  F (36.9 C), temperature source  Oral, resp. rate 20, height 5\' 11"  (1.803 m), weight 118.8 kg, SpO2 97 %.  .  Intake/Output Summary (Last 24 hours) at 02/20/2019 1243 Last data filed at 02/20/2019 8101 Gross per 24 hour  Intake 2388.76 ml  Output -  Net 2388.76 ml    Exam VITAL SIGNS: Blood pressure 132/69, pulse 66, temperature 98.4 F (36.9 C), temperature source Oral, resp. rate 20, height 5\' 11"  (1.803 m), weight 118.8 kg, SpO2 97 %.  GENERAL:  38 y.o.-year-old patient lying in the bed with no acute distress.  EYES: Pupils equal, round, reactive to light and accommodation. No scleral icterus. Extraocular muscles intact.  HEENT: Head atraumatic, normocephalic. Oropharynx and nasopharynx clear.  NECK:  Supple, no jugular venous distention. No thyroid enlargement, no tenderness.  LUNGS: Normal breath sounds bilaterally, no wheezing, rales,rhonchi or crepitation. No use of accessory muscles of respiration.  CARDIOVASCULAR: S1, S2 normal. No murmurs, rubs, or gallops.  ABDOMEN: Soft, nontender, nondistended. Bowel sounds present. No organomegaly or mass.  EXTREMITIES: No pedal edema, cyanosis, or clubbing.  NEUROLOGIC: Cranial nerves II through XII are intact. Muscle strength 5/5 in all extremities. Sensation intact. Gait not checked.  PSYCHIATRIC: The patient is alert and oriented x 3.  SKIN: No obvious rash, lesion, or ulcer.   Data Review     CBC w Diff:  Lab Results  Component Value Date   WBC 10.2 02/20/2019   HGB 13.0 02/20/2019   HCT 38.5 (L) 02/20/2019   PLT 269 02/20/2019   LYMPHOPCT 11 02/18/2019   MONOPCT 10 02/18/2019   EOSPCT 1 02/18/2019   BASOPCT 0 02/18/2019   CMP:  Lab Results  Component Value Date   NA 139 02/20/2019   K 3.8 02/20/2019   CL 103 02/20/2019   CO2 27 02/20/2019   BUN 13 02/20/2019   CREATININE 0.92 02/20/2019   PROT 8.2 (H) 02/18/2019   ALBUMIN 4.2 02/18/2019   BILITOT 0.7 02/18/2019   ALKPHOS 80 02/18/2019   AST 22 02/18/2019   ALT 22 02/18/2019  .  Micro  Results Recent Results (from the past 240 hour(s))  SARS CORONAVIRUS 2 (TAT 6-12 HRS) Nasal Swab Aptima Multi Swab     Status: None   Collection Time: 02/18/19  3:58 PM   Specimen: Aptima Multi Swab; Nasal Swab  Result Value Ref Range Status   SARS Coronavirus 2 NEGATIVE NEGATIVE Final    Comment: (NOTE) SARS-CoV-2 target nucleic acids are NOT DETECTED. The SARS-CoV-2 RNA is generally detectable in upper and lower respiratory specimens during the acute phase of infection. Negative results do not preclude SARS-CoV-2 infection, do not rule out co-infections with other pathogens, and should not be used as the sole basis for treatment or other patient management decisions. Negative results must be combined with clinical observations, patient history, and epidemiological information. The expected result is Negative. Fact Sheet for Patients: HairSlick.no Fact Sheet for Healthcare Providers: quierodirigir.com This test is not yet approved or cleared by the [place] FDA and  has been authorized for detection and/or diagnosis of SARS-CoV-2 by FDA under an Emergency Use Authorization (EUA). This EUA will remain  in effect (meaning this test can be used) for the duration of the COVID-19 declaration under Section 56 4(b)(1) of the Act, 21 U.S.C. section 360bbb-3(b)(1), unless the authorization is terminated or revoked sooner. Performed at [facility], [address] [place], [place] [address]   CULTURE, BLOOD (ROUTINE X 2) w Reflex to ID  Panel     Status: None (Preliminary result)   Collection Time: 02/18/19  7:22 PM   Specimen: BLOOD  Result Value Ref Range Status   Specimen Description BLOOD LFOA  Final   Special Requests   Final    BOTTLES DRAWN AEROBIC AND ANAEROBIC Blood Culture adequate volume   Culture   Final    NO GROWTH 2 DAYS Performed at [facility], [address] [place], [place] [address]     Report Status PENDING  Incomplete  CULTURE, BLOOD (ROUTINE X 2) w Reflex to ID Panel     Status: None (Preliminary result)   Collection Time: 02/18/19  7:29 PM   Specimen: BLOOD  Result Value Ref Range Status   Specimen Description BLOOD LAC  Final   Special Requests   Final    BOTTLES DRAWN AEROBIC AND ANAEROBIC Blood Culture adequate volume   Culture   Final    NO GROWTH 2 DAYS Performed at [facility], [address] [place], [place] [address]    Report Status PENDING  Incomplete        Code Status Orders  (From admission, onward)         Start     Ordered   02/18/19 1544  Full code  Continuous     02/18/19 1546        Code Status History    This patient has a current code status but no historical code status.   Advance Care Planning Activity          Follow-up Information    Mick SellFitzgerald, David P, MD. Go on 02/26/2019.   Specialty: Infectious Diseases Why: Tuesday September 1th at 11:30am for a follow-up appointment  Contact information: [address] [place] [address] [phone]        Sung AmabileSakai, Isami, DO. Go on 04/17/2019.   Specialty: Surgery Why: Dr. Tonna BoehringerSakai, Wednesday, 10/21 at 1:30 p.m.  [phone] Contact information: [address] [place] [address] [phone]           Discharge Medications   Allergies as of 02/20/2019   Not on File     Medication List    TAKE these medications   busPIRone 7.5 MG tablet Commonly known as: BUSPAR Take 7.5 mg by mouth daily.   ciprofloxacin 500 MG tablet Commonly known as: Cipro Take 1 tablet (500 mg total) by mouth 2 (two) times daily for 7 days.   escitalopram 20 MG tablet Commonly known as: LEXAPRO Take 20 mg by mouth daily.   levothyroxine 125 MCG tablet Commonly known as: SYNTHROID Take 125 mcg by mouth daily before breakfast.   lisinopril-hydrochlorothiazide 20-25 MG tablet Commonly known as: ZESTORETIC Take 1 tablet by mouth daily.    Melatonin 5 MG Tabs Take 5 mg by mouth at bedtime as needed.   metroNIDAZOLE 500 MG tablet Commonly known as: Flagyl Take 1 tablet (500 mg total) by mouth 3 (three) times daily for 7 days.          Total Time in preparing paper work, data evaluation and todays exam - 35 minutes  Auburn BilberryShreyang Sherrilynn Gudgel M.D on 02/20/2019 at 12:43 PM [facility]   Office  [phone]

## 2019-02-20 NOTE — Progress Notes (Signed)
Discharge instructions reviewed with patient including followup visits and new medications.  Understanding was verbalized and all questions were answered.  IV removed without complication; patient tolerated well.  Patient discharged home via wheelchair in stable condition escorted by nursing staff.  

## 2019-02-23 LAB — CULTURE, BLOOD (ROUTINE X 2)
Culture: NO GROWTH
Culture: NO GROWTH
Special Requests: ADEQUATE
Special Requests: ADEQUATE

## 2019-03-11 ENCOUNTER — Other Ambulatory Visit: Payer: Self-pay | Admitting: Infectious Diseases

## 2019-03-11 DIAGNOSIS — K51414 Inflammatory polyps of colon with abscess: Secondary | ICD-10-CM

## 2019-03-12 ENCOUNTER — Ambulatory Visit
Admission: RE | Admit: 2019-03-12 | Discharge: 2019-03-12 | Disposition: A | Discharge status: Home / Self Care | Payer: 59 | Source: Ambulatory Visit | Attending: Infectious Diseases | Admitting: Infectious Diseases

## 2019-03-12 ENCOUNTER — Other Ambulatory Visit: Payer: Self-pay

## 2019-03-12 DIAGNOSIS — K51414 Inflammatory polyps of colon with abscess: Secondary | ICD-10-CM | POA: Insufficient documentation

## 2019-03-12 MED ORDER — IOHEXOL 300 MG/ML  SOLN
100.0000 mL | Freq: Once | INTRAMUSCULAR | Status: AC | PRN
  Administered 2019-03-12: 100 mL via INTRAVENOUS

## 2019-04-22 ENCOUNTER — Other Ambulatory Visit: Payer: Self-pay

## 2019-04-22 ENCOUNTER — Other Ambulatory Visit
Admission: RE | Admit: 2019-04-22 | Discharge: 2019-04-22 | Disposition: A | Discharge status: Home / Self Care | Payer: 59 | Source: Ambulatory Visit | Attending: Surgery | Admitting: Surgery

## 2019-04-22 DIAGNOSIS — Z20828 Contact with and (suspected) exposure to other viral communicable diseases: Secondary | ICD-10-CM | POA: Insufficient documentation

## 2019-04-22 DIAGNOSIS — Z01812 Encounter for preprocedural laboratory examination: Secondary | ICD-10-CM | POA: Insufficient documentation

## 2019-04-22 LAB — SARS CORONAVIRUS 2 (TAT 6-24 HRS): SARS Coronavirus 2: NEGATIVE

## 2019-04-24 ENCOUNTER — Encounter: Payer: Self-pay | Admitting: *Deleted

## 2019-04-25 ENCOUNTER — Ambulatory Visit: Payer: 59 | Admitting: Anesthesiology

## 2019-04-25 ENCOUNTER — Encounter
Admission: RE | Disposition: A | Discharge status: Home / Self Care | Payer: Self-pay | Source: Home / Self Care | Attending: Surgery

## 2019-04-25 ENCOUNTER — Ambulatory Visit
Admission: RE | Admit: 2019-04-25 | Discharge: 2019-04-25 | Disposition: A | Discharge status: Home / Self Care | Payer: 59 | Attending: Surgery | Admitting: Surgery

## 2019-04-25 ENCOUNTER — Encounter: Payer: Self-pay | Admitting: *Deleted

## 2019-04-25 DIAGNOSIS — Z6835 Body mass index (BMI) 35.0-35.9, adult: Secondary | ICD-10-CM | POA: Diagnosis not present

## 2019-04-25 DIAGNOSIS — Z87891 Personal history of nicotine dependence: Secondary | ICD-10-CM | POA: Diagnosis not present

## 2019-04-25 DIAGNOSIS — Z79890 Hormone replacement therapy (postmenopausal): Secondary | ICD-10-CM | POA: Insufficient documentation

## 2019-04-25 DIAGNOSIS — G4730 Sleep apnea, unspecified: Secondary | ICD-10-CM | POA: Diagnosis not present

## 2019-04-25 DIAGNOSIS — E669 Obesity, unspecified: Secondary | ICD-10-CM | POA: Insufficient documentation

## 2019-04-25 DIAGNOSIS — Z79899 Other long term (current) drug therapy: Secondary | ICD-10-CM | POA: Diagnosis not present

## 2019-04-25 DIAGNOSIS — I10 Essential (primary) hypertension: Secondary | ICD-10-CM | POA: Insufficient documentation

## 2019-04-25 DIAGNOSIS — K5732 Diverticulitis of large intestine without perforation or abscess without bleeding: Secondary | ICD-10-CM | POA: Insufficient documentation

## 2019-04-25 DIAGNOSIS — K621 Rectal polyp: Secondary | ICD-10-CM | POA: Diagnosis not present

## 2019-04-25 HISTORY — DX: Diverticulitis of large intestine without perforation or abscess without bleeding: K57.32

## 2019-04-25 HISTORY — PX: COLONOSCOPY WITH PROPOFOL: SHX5780

## 2019-04-25 SURGERY — COLONOSCOPY WITH PROPOFOL
Anesthesia: General

## 2019-04-25 MED ORDER — FENTANYL CITRATE (PF) 100 MCG/2ML IJ SOLN
INTRAMUSCULAR | Status: DC | PRN
  Administered 2019-04-25: 50 ug via INTRAVENOUS

## 2019-04-25 MED ORDER — SODIUM CHLORIDE 0.9 % IV SOLN
INTRAVENOUS | Status: DC
  Administered 2019-04-25: 13:00:00 via INTRAVENOUS

## 2019-04-25 MED ORDER — LIDOCAINE HCL (CARDIAC) PF 100 MG/5ML IV SOSY
PREFILLED_SYRINGE | INTRAVENOUS | Status: DC | PRN
  Administered 2019-04-25: 60 mg via INTRAVENOUS

## 2019-04-25 MED ORDER — FENTANYL CITRATE (PF) 100 MCG/2ML IJ SOLN
INTRAMUSCULAR | Status: AC
  Filled 2019-04-25: qty 2

## 2019-04-25 MED ORDER — PROPOFOL 500 MG/50ML IV EMUL
INTRAVENOUS | Status: DC | PRN
  Administered 2019-04-25: 180 ug/kg/min via INTRAVENOUS
  Administered 2019-04-25: 150 ug/kg/min via INTRAVENOUS

## 2019-04-25 MED ORDER — PROPOFOL 10 MG/ML IV BOLUS
INTRAVENOUS | Status: DC | PRN
  Administered 2019-04-25: 40 mg via INTRAVENOUS
  Administered 2019-04-25: 50 mg via INTRAVENOUS
  Administered 2019-04-25: 40 mg via INTRAVENOUS
  Administered 2019-04-25: 50 mg via INTRAVENOUS
  Administered 2019-04-25: 80 mg via INTRAVENOUS

## 2019-04-25 MED ORDER — PROPOFOL 500 MG/50ML IV EMUL
INTRAVENOUS | Status: AC
  Filled 2019-04-25: qty 50

## 2019-04-25 NOTE — Op Note (Signed)
[facility] Patient Name: Krishang Reading Procedure Date: 04/25/2019 12:44 PM MRN: [id number] Account #: [id number] Date of Birth: 1980-10-26 Admit Type: Outpatient Age: 38 Room: [facility] ENDO ROOM 1 Gender: Male Note Status: Finalized Procedure:            Colonoscopy Indications:          Diverticulosis of the colon Providers:            Arville Go MD, MD Medicines:            Propofol per Anesthesia Complications:        IV infiltrated, but replaced without any issue during                        procedure Procedure:            Pre-Anesthesia Assessment:                       - After reviewing the risks and benefits, the patient                        was deemed in satisfactory condition to undergo the                        procedure in an ambulatory setting.                       After obtaining informed consent, the colonoscope was                        passed under direct vision. Throughout the procedure,                        the patient's blood pressure, pulse, and oxygen                        saturations were monitored continuously. The                        Colonoscope was introduced through the anus and                        advanced to the the cecum, identified by the ileocecal                        valve. The colonoscopy was performed without                        difficulty. The patient tolerated the procedure well.                        The quality of the bowel preparation was excellent.                        Scope insertion time was 5 minutes. Scope withdrawal                        time was 13 minutes. Findings:      sessile, inflammed large cecal mass with evidence of recent bleeding.       cold forceps biopsy taken for histology.  A 2 mm polyp was found in the rectum. The polyp was sessile. The polyp       was removed with a cold snare. Resection and retrieval were complete.      Multiple small and large-mouthed  diverticula were found in the sigmoid       colon. Erythema was seen in association with the diverticular opening.      The retroflexed view of the distal rectum and anal verge was normal and       showed no anal or rectal abnormalities.      The perianal and digital rectal examinations were normal. Impression:           - One 2 mm polyp in the rectum, removed with a cold                        snare. Resected and retrieved.                       - Moderate diverticulosis in the sigmoid colon.                        Erythema was seen in association with the diverticular                        opening.                       - The distal rectum and anal verge are normal on                        retroflexion view.                       - see body of report Recommendation:       - Written discharge instructions were provided to the                        patient.                       - Resume regular diet.                       - Discharge patient to home.                       - Await pathology results. Procedure Code(s):    --- Professional ---                       [phone], Colonoscopy, flexible; with removal of tumor(s),                        polyp(s), or other lesion(s) by snare technique Diagnosis Code(s):    --- Professional ---                       K62.1, Rectal polyp                       K57.30, Diverticulosis of large intestine without                        perforation or abscess without bleeding CPT  copyright 2019 [employer] Medical Association. All rights reserved. The codes documented in this report are preliminary and upon coder review may  be revised to meet current compliance requirements. Dr. Harrie ForemanIsami Saki, MD Arville GoIsami Sakia MD, MD 04/25/2019 1:46:42 PM This report has been signed electronically. Number of Addenda: 0 Note Initiated On: 04/25/2019 12:44 PM Scope Withdrawal Time: 0 hours 13 minutes 17 seconds  Total Procedure Duration: 0 hours 18 minutes 5 seconds   Estimated Blood Loss: Estimated blood loss was minimal.      [facility]

## 2019-04-25 NOTE — Anesthesia Preprocedure Evaluation (Signed)
Anesthesia Evaluation  Patient identified by MRN, date of birth, ID band Patient awake    Reviewed: Allergy & Precautions, H&P , NPO status , Patient's Chart, lab work & pertinent test results  Airway Mallampati: II  TM Distance: >3 FB Neck ROM: full    Dental  (+) Teeth Intact   Pulmonary sleep apnea and Continuous Positive Airway Pressure Ventilation , former smoker,           Cardiovascular hypertension,      Neuro/Psych negative neurological ROS  negative psych ROS   GI/Hepatic negative GI ROS, Neg liver ROS,   Endo/Other  negative endocrine ROS  Renal/GU negative Renal ROS  negative genitourinary   Musculoskeletal   Abdominal   Peds  Hematology negative hematology ROS (+)   Anesthesia Other Findings Obesity  Reproductive/Obstetrics negative OB ROS                             Anesthesia Physical Anesthesia Plan  ASA: II  Anesthesia Plan: General   Post-op Pain Management:    Induction:   PONV Risk Score and Plan: Propofol infusion and TIVA  Airway Management Planned: Natural Airway and Simple Face Mask  Additional Equipment:   Intra-op Plan:   Post-operative Plan:   Informed Consent: I have reviewed the patients History and Physical, chart, labs and discussed the procedure including the risks, benefits and alternatives for the proposed anesthesia with the patient or authorized representative who has indicated his/her understanding and acceptance.     Dental Advisory Given  Plan Discussed with: Anesthesiologist, CRNA and Surgeon  Anesthesia Plan Comments:         Anesthesia Quick Evaluation

## 2019-04-25 NOTE — H&P (Signed)
Subjective:   CC: Diverticulitis of large intestine without perforation or abscess without bleeding [K57.32]   HPI: Jordan Church is a 38 y.o. male who is here for followup from above. No issues since last visit, here to schedule colonoscopy   Current Medications: has a current medication list which includes the following prescription(s): digestive advantage prob gummy, cetirizine, escitalopram oxalate, fluticasone propionate, levothyroxine, lisinopril-hydrochlorothiazide, psyllium, and buspirone.  Allergies:  No Known Allergies  ROS: General: Denies weight loss, weight gain, fatigue, fevers, chills, and night sweats. Heart: Denies chest pain, palpitations, racing heart, irregular heartbeat, leg pain or swelling, and decreased activity tolerance. Respiratory: Denies breathing difficulty, shortness of breath, wheezing, cough, and sputum. GI: Denies change in appetite, heartburn, nausea, vomiting, constipation, diarrhea, and blood in stool. GU: Denies difficulty urinating, pain with urinating, urgency, frequency, blood in urine   Objective:    BP 116/74  Pulse 84  Ht 177.8 cm (5\' 10" )  Wt (!) 114.8 kg (253 lb)  BMI 36.30 kg/m   Constitutional : alert, appears stated age, cooperative and no distress  Gastrointestinal: soft, non-tender; bowel sounds normal; no masses, no organomegaly.  Musculoskeletal: Steady gait and movement  Skin: Cool and moist.  Psychiatric: Normal affect, non-agitated, not confused    LABS:  N/A   RADS: N/A  Assessment:    Diverticulitis of large intestine without perforation or abscess without bleeding [K57.32]  Plan:    1. Healing well. Initial episode now resolved. Will schedule for f/u colonoscopy. Risks include bleeding, perforation. Benefits include diagnostic workup. Alternatives include continued observation.  Pt verbalized understanding and wishes to proceed. Will schedule  Electronically signed by Benjamine Sprague, DO at 04/02/2019  4:31 PM EDT

## 2019-04-25 NOTE — Interval H&P Note (Signed)
History and Physical Interval Note:  04/25/2019 12:42 PM  Jordan Church  has presented today for surgery, with the diagnosis of DIVERTICULITIS.  The various methods of treatment have been discussed with the patient and family. After consideration of risks, benefits and other options for treatment, the patient has consented to  Procedure(s): COLONOSCOPY WITH PROPOFOL (N/A) as a surgical intervention.  The patient's history has been reviewed, patient examined, no change in status, stable for surgery.  I have reviewed the patient's chart and labs.  Questions were answered to the patient's satisfaction.     [name]

## 2019-04-25 NOTE — Transfer of Care (Signed)
Immediate Anesthesia Transfer of Care Note  Patient: Jordan Church  Procedure(s) Performed: COLONOSCOPY WITH PROPOFOL (N/A )  Patient Location: PACU  Anesthesia Type:General  Level of Consciousness: awake, alert  and oriented  Airway & Oxygen Therapy: Patient Spontanous Breathing  Post-op Assessment: Report given to RN and Post -op Vital signs reviewed and stable  Post vital signs: Reviewed and stable  Last Vitals:  Vitals Value Taken Time  BP 133/92 04/25/19 1351  Temp 36.1 C 04/25/19 1349  Pulse 58 04/25/19 1354  Resp 11 04/25/19 1354  SpO2 99 % 04/25/19 1354  Vitals shown include unvalidated device data.  Last Pain:  Vitals:   04/25/19 1349  TempSrc: Tympanic  PainSc:          Complications: No apparent anesthesia complications

## 2019-04-25 NOTE — Anesthesia Post-op Follow-up Note (Signed)
Anesthesia QCDR form completed.        

## 2019-04-26 ENCOUNTER — Encounter: Payer: Self-pay | Admitting: Surgery

## 2019-04-26 NOTE — Anesthesia Postprocedure Evaluation (Signed)
Anesthesia Post Note  Patient: Jordan Church  Procedure(s) Performed: COLONOSCOPY WITH PROPOFOL (N/A )  Patient location during evaluation: PACU Anesthesia Type: General Level of consciousness: awake and alert Pain management: pain level controlled Vital Signs Assessment: post-procedure vital signs reviewed and stable Respiratory status: spontaneous breathing, nonlabored ventilation and respiratory function stable Cardiovascular status: blood pressure returned to baseline and stable Postop Assessment: no apparent nausea or vomiting Anesthetic complications: no     Last Vitals:  Vitals:   04/25/19 1409 04/25/19 1419  BP: 133/80 (!) 142/80  Pulse: (!) 58 68  Resp: 15 16  Temp:    SpO2: 98% 99%    Last Pain:  Vitals:   04/25/19 1419  TempSrc:   PainSc: 0-No pain                 Durenda Hurt

## 2019-04-30 LAB — SURGICAL PATHOLOGY

## 2021-06-04 IMAGING — CT CT ABD-PELV W/ CM
1 of 2 series · 14 of 32 positions shown, 18 images · IV contrast (APPLIED)
Comparison: 02/18/2019

CLINICAL DATA: Follow-up diverticular abscess, persistent abdominal
pain

EXAM:
CT ABDOMEN AND PELVIS WITH CONTRAST
TECHNIQUE: Multidetector CT imaging of the abdomen and pelvis was performed
using the standard protocol following bolus administration of
intravenous contrast.
CONTRAST:  100mL OMNIPAQUE IOHEXOL 300 MG/ML SOLN, additional oral
enteric contrast

[Series 2: axial st · axial · 0.90mm/px · z∈[-991,-506]mm · 14 of 107 slices shown, 18 images]
[im 5/107  soft-tissue]
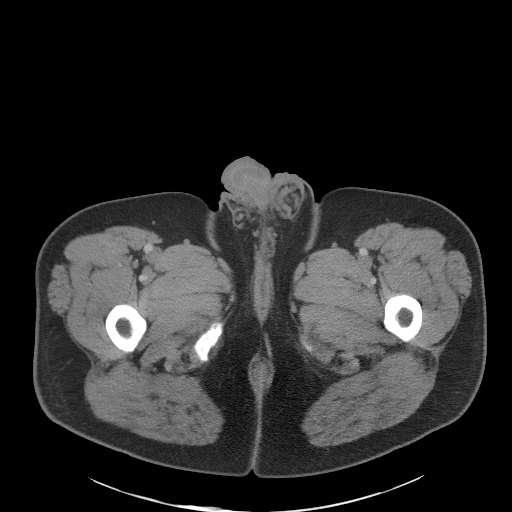
[im 5/107  bone]
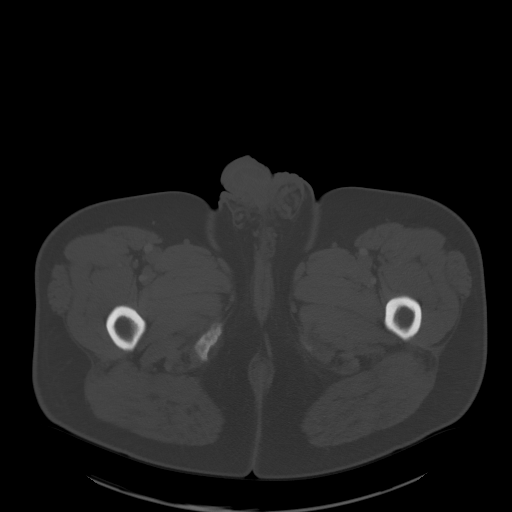
[im 14/107  soft-tissue]
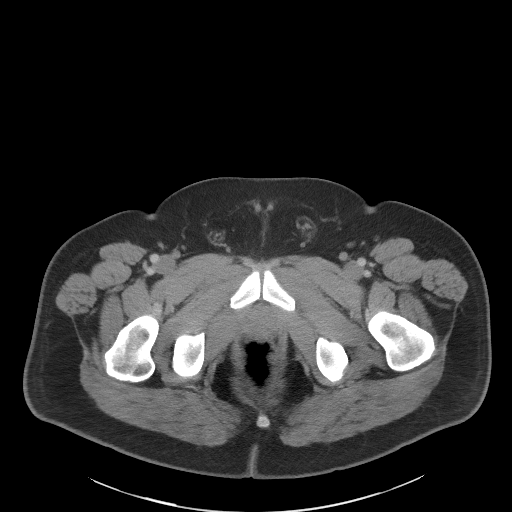
[im 24/107  soft-tissue]
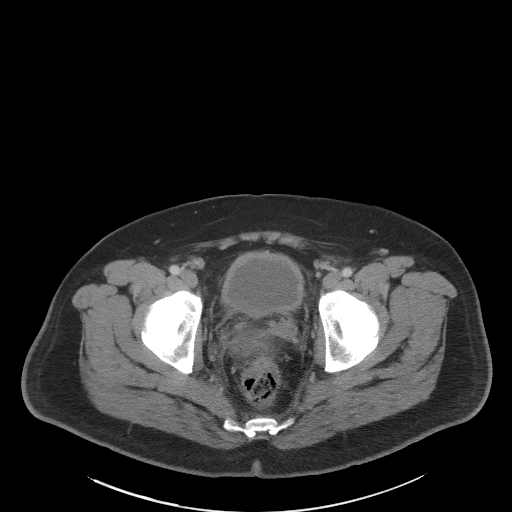
[im 33/107  soft-tissue]
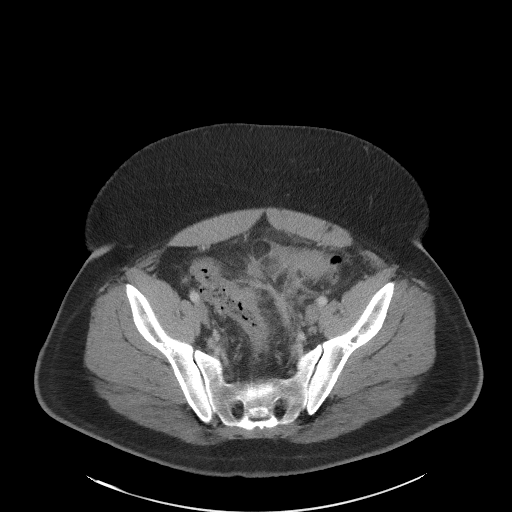
[im 42/107  soft-tissue]
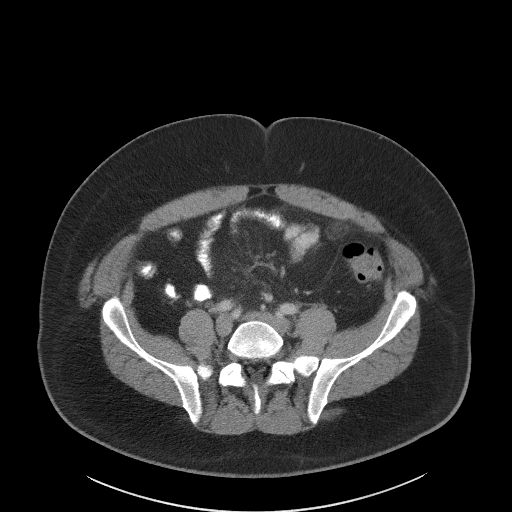
[im 51/107  soft-tissue]
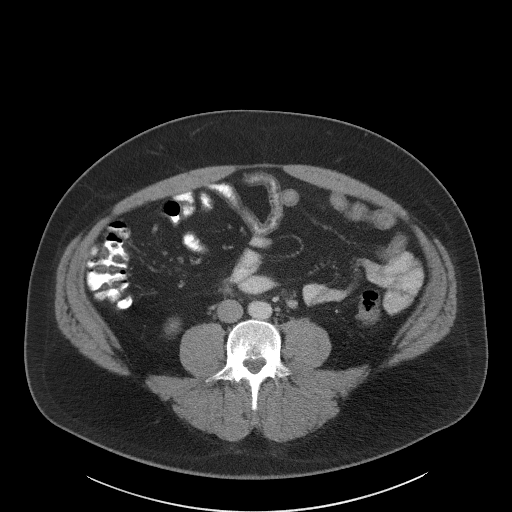
[im 56/107  soft-tissue]
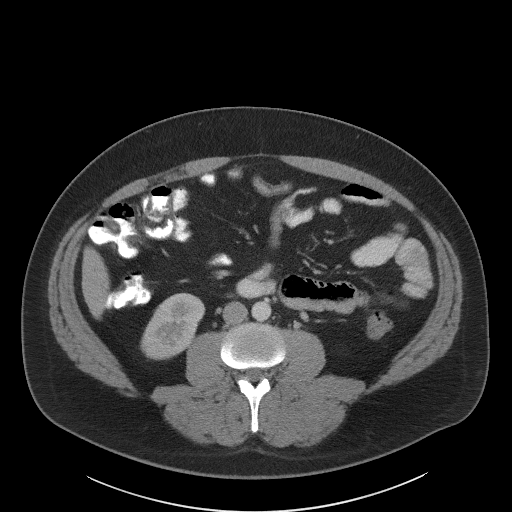
[im 65/107  soft-tissue]
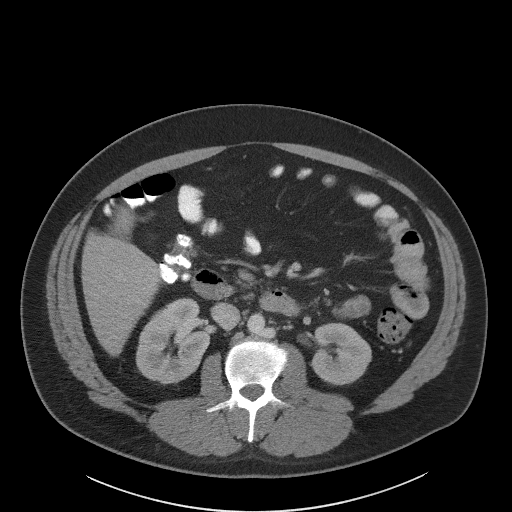
[im 74/107  soft-tissue]
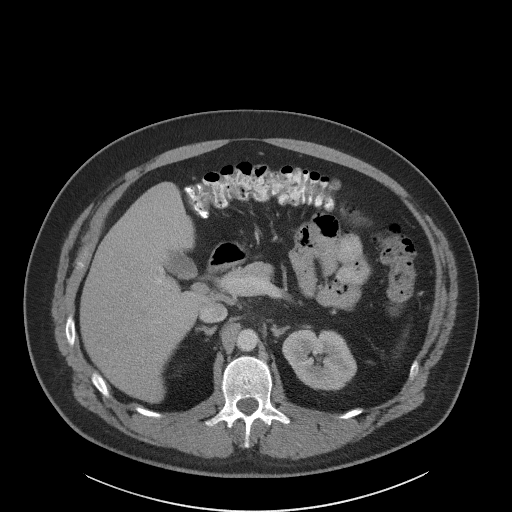
[im 74/107  bone]
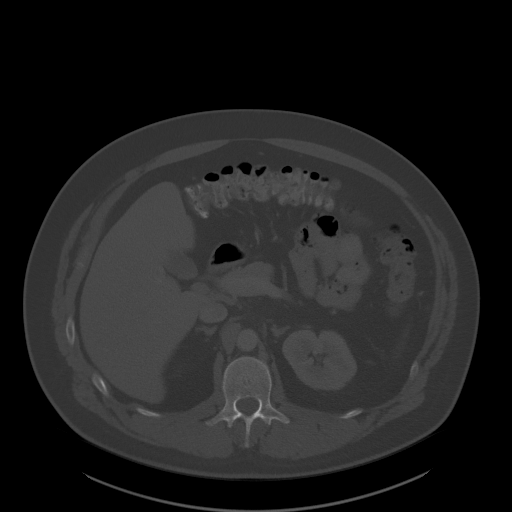
[im 83/107  soft-tissue]
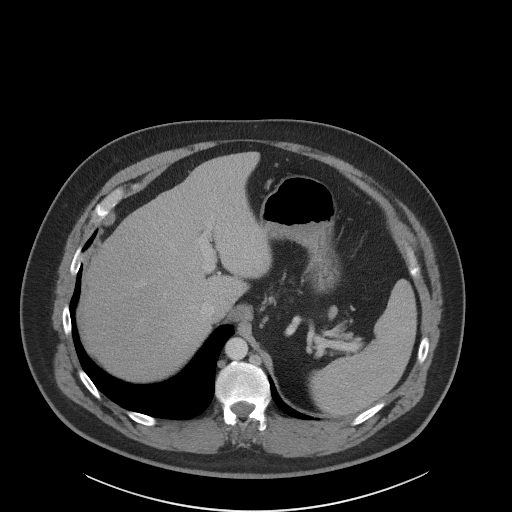
[im 88/107  lung]
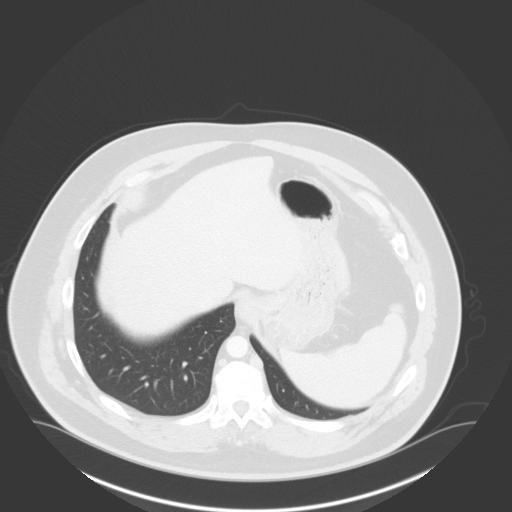
[im 93/107  soft-tissue]
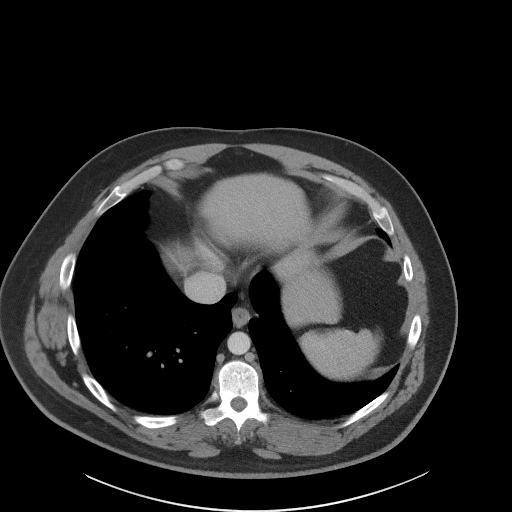
[im 93/107  lung]
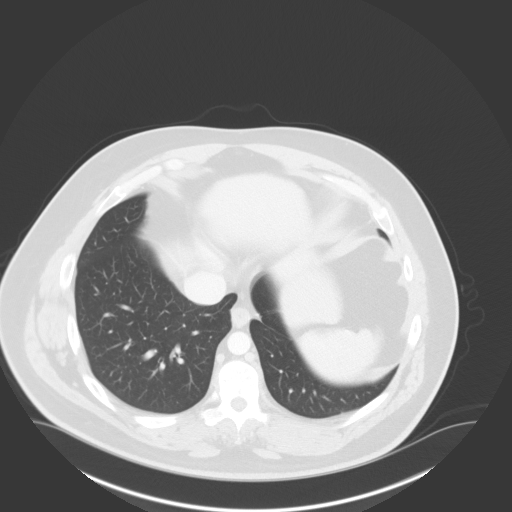
[im 97/107  lung]
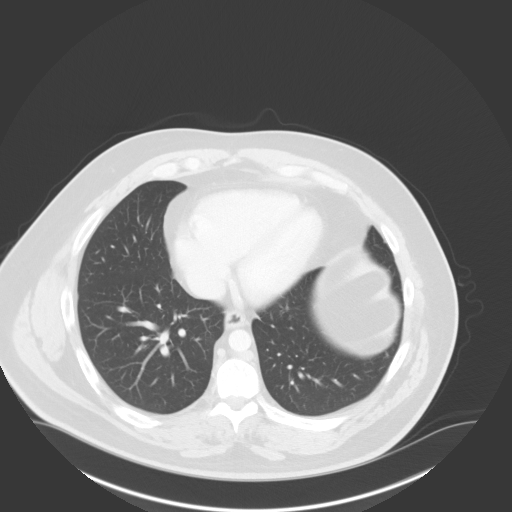
[im 102/107  soft-tissue]
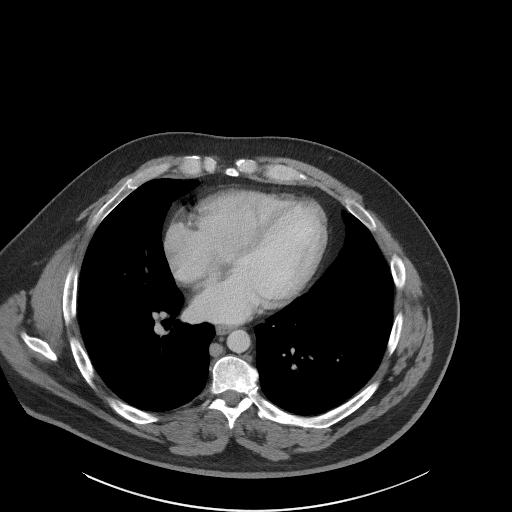
[im 102/107  lung]
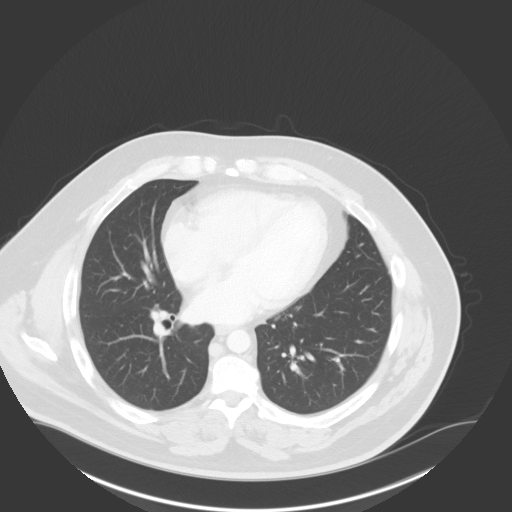

[14 of 32 positions shown; findings below may reference images not displayed]

FINDINGS: Lower chest: No acute abnormality.

Hepatobiliary: No solid liver abnormality is seen. No gallstones,
gallbladder wall thickening, or biliary dilatation.

Pancreas: Unremarkable. No pancreatic ductal dilatation or
surrounding inflammatory changes.

Spleen: Normal in size without significant abnormality.

Adrenals/Urinary Tract: Adrenal glands are unremarkable. Kidneys are
normal, without renal calculi, solid lesion, or hydronephrosis.
Thickening of the bladder, likely reactive.

Stomach/Bowel: Stomach is within normal limits. Appendix appears
normal. Redemonstrated sigmoid diverticulosis with diffuse sigmoid
and rectal wall thickening. There is extensive, persistent fat
stranding in the sigmoid mesocolon with and unchanged fluid
collection anterior to the rectum within the perirectal fascia
measuring 3.5 x 2.3 cm, consistent with diverticular abscess (series
2, image 82). A smaller fluid collection seen more superiorly within
the mesocolon on prior examination is resolved (series 2, image 73).

Vascular/Lymphatic: No significant vascular findings are present. No
enlarged abdominal or pelvic lymph nodes.

Reproductive: No mass or other significant abnormality.

Other: No abdominal wall hernia or abnormality. No abdominopelvic
ascites.

Musculoskeletal: No acute or significant osseous findings.
IMPRESSION: 1. Redemonstrated sigmoid diverticulosis with diffuse sigmoid and
rectal wall thickening. There is extensive, persistent fat stranding
in the sigmoid mesocolon with and unchanged fluid collection
anterior to the rectum within the perirectal fascia measuring 3.5 x
2.3 cm, consistent with persistent diverticular abscess (series 2,
image 82). A smaller fluid collection seen more superiorly within
the mesocolon on prior examination is resolved (series 2, image 73).

2. Thickening of the bladder, likely reactive to adjacent
inflammation.

## 2021-12-08 DIAGNOSIS — F32A Depression, unspecified: Secondary | ICD-10-CM | POA: Diagnosis not present

## 2021-12-08 DIAGNOSIS — I10 Essential (primary) hypertension: Secondary | ICD-10-CM | POA: Diagnosis not present

## 2021-12-08 DIAGNOSIS — F411 Generalized anxiety disorder: Secondary | ICD-10-CM | POA: Diagnosis not present

## 2021-12-08 DIAGNOSIS — R7303 Prediabetes: Secondary | ICD-10-CM | POA: Diagnosis not present

## 2021-12-08 DIAGNOSIS — R69 Illness, unspecified: Secondary | ICD-10-CM | POA: Diagnosis not present

## 2021-12-08 DIAGNOSIS — Z6841 Body Mass Index (BMI) 40.0 and over, adult: Secondary | ICD-10-CM | POA: Diagnosis not present

## 2021-12-08 DIAGNOSIS — Z0000 Encounter for general adult medical examination without abnormal findings: Secondary | ICD-10-CM | POA: Diagnosis not present

## 2021-12-08 DIAGNOSIS — F419 Anxiety disorder, unspecified: Secondary | ICD-10-CM | POA: Diagnosis not present

## 2021-12-08 DIAGNOSIS — Z9989 Dependence on other enabling machines and devices: Secondary | ICD-10-CM | POA: Diagnosis not present

## 2021-12-08 DIAGNOSIS — G4733 Obstructive sleep apnea (adult) (pediatric): Secondary | ICD-10-CM | POA: Diagnosis not present

## 2021-12-08 DIAGNOSIS — E039 Hypothyroidism, unspecified: Secondary | ICD-10-CM | POA: Diagnosis not present
